# Patient Record
Sex: Female | Born: 1965 | Race: White | Hispanic: No | Marital: Married | Smoking: Former smoker
Health system: Southern US, Community
[De-identification: ages and names within clinical notes are randomized; demographics above are authoritative.]

## PROBLEM LIST (undated history)

## (undated) DIAGNOSIS — L719 Rosacea, unspecified: Secondary | ICD-10-CM

## (undated) DIAGNOSIS — I471 Supraventricular tachycardia, unspecified: Secondary | ICD-10-CM

## (undated) HISTORY — DX: Rosacea, unspecified: L71.9

## (undated) HISTORY — PX: ABLATION OF DYSRHYTHMIC FOCUS: SHX254

## (undated) HISTORY — PX: ENDOMETRIAL ABLATION: SHX621

## (undated) HISTORY — DX: Supraventricular tachycardia, unspecified: I47.10

## (undated) HISTORY — PX: APPENDECTOMY: SHX54

---

## 2000-07-12 ENCOUNTER — Ambulatory Visit (HOSPITAL_COMMUNITY): Admission: RE | Admit: 2000-07-12 | Discharge: 2000-07-13 | Payer: Self-pay | Admitting: Internal Medicine

## 2004-04-12 ENCOUNTER — Other Ambulatory Visit: Admission: RE | Admit: 2004-04-12 | Discharge: 2004-04-12 | Payer: Self-pay | Admitting: Obstetrics & Gynecology

## 2005-03-23 ENCOUNTER — Other Ambulatory Visit: Admission: RE | Admit: 2005-03-23 | Discharge: 2005-03-23 | Payer: Self-pay | Admitting: Obstetrics & Gynecology

## 2006-03-14 HISTORY — PX: BREAST BIOPSY: SHX20

## 2007-01-05 ENCOUNTER — Encounter: Admission: RE | Admit: 2007-01-05 | Discharge: 2007-01-05 | Payer: Self-pay | Admitting: Obstetrics & Gynecology

## 2008-04-14 ENCOUNTER — Encounter: Admission: RE | Admit: 2008-04-14 | Discharge: 2008-04-14 | Payer: Self-pay | Admitting: Obstetrics & Gynecology

## 2008-11-12 LAB — CONVERTED CEMR LAB: Pap Smear: NORMAL

## 2009-10-19 ENCOUNTER — Ambulatory Visit: Payer: Self-pay | Admitting: Internal Medicine

## 2009-10-19 DIAGNOSIS — R238 Other skin changes: Secondary | ICD-10-CM | POA: Insufficient documentation

## 2009-10-19 LAB — CONVERTED CEMR LAB
Basophils Absolute: 0.1 10*3/uL (ref 0.0–0.1)
HCT: 36.9 % (ref 36.0–46.0)
Hemoglobin: 12.5 g/dL (ref 12.0–15.0)
Lymphocytes Relative: 23.8 % (ref 12.0–46.0)
Lymphs Abs: 1.6 10*3/uL (ref 0.7–4.0)
MCV: 90.1 fL (ref 78.0–100.0)
Monocytes Absolute: 0.7 10*3/uL (ref 0.1–1.0)
Monocytes Relative: 10.2 % (ref 3.0–12.0)
Platelets: 256 10*3/uL (ref 150.0–400.0)
Prothrombin Time: 10 s (ref 9.7–11.8)
aPTT: 28.6 s (ref 21.7–28.8)

## 2010-04-13 NOTE — Letter (Signed)
Summary: Results Follow-up Letter  [facility]  [address] [place], [place] [address]   Phone: [phone]  Fax: [phone]    10/19/2009  [address] [place], [place]  [address]  Dear Ms. Hemmerich,   The following are the results of your recent test(s):  Test     Result     Blood work     all normal   _________________________________________________________  Please call for an appointment as needed _________________________________________________________ _________________________________________________________ _________________________________________________________  Sincerely,  Sanda Linger MD [facility]

## 2010-04-13 NOTE — Assessment & Plan Note (Signed)
Summary: NEW/BRUISING /BCBS / #   Vital Signs:  Patient profile:   45 year old female Menstrual status:  regular LMP:     10/14/2009 Height:      64 inches Weight:      123.38 pounds BMI:     21.25 O2 Sat:      98 % on Room air Temp:     98.6 degrees F oral Pulse rate:   73 / minute Pulse rhythm:   regular Resp:     16 per minute BP sitting:   96 / 62  (left arm) Cuff size:   large  Vitals Entered By: Rock Nephew CMA (October 19, 2009 1:27 PM)  O2 Flow:  Room air  Primary Care Provider:  Etta Grandchild MD   History of Present Illness: New to me she complains of some bruising on both thighs for several weeks. She is an avid biker/runner but she can't recall any injuries. She takes Aleve for pain.  Preventive Screening-Counseling & Management  Alcohol-Tobacco     Alcohol drinks/day: 1     Alcohol type: wine     >5/day in last 3 mos: no     Alcohol Counseling: not indicated; use of alcohol is not excessive or problematic     Feels need to cut down: no     Feels annoyed by complaints: no     Feels guilty re: drinking: no     Needs 'eye opener' in am: no     Smoking Status: quit     Year Started: 1983     Year Quit: 2005     Pack years: 10     Tobacco Counseling: to remain off tobacco products  Caffeine-Diet-Exercise     Does Patient Exercise: yes  Hep-HIV-STD-Contraception     Hepatitis Risk: no risk noted     HIV Risk: no risk noted     STD Risk: no risk noted  Safety-Violence-Falls     Seat Belt Use: yes     Helmet Use: yes     Firearms in the Home: firearms in the home     Firearm Counseling: to practice firearm safety     Smoke Detectors: yes     Violence in the Home: no risk noted     Sexual Abuse: no      Sexual History:  currently monogamous.        Drug Use:  never and no.        Blood Transfusions:  no.    Medications Prior to Update: 1)  None  Current Medications (verified): 1)  Doxycycline .... 1/2 Once Daily For Acne  Allergies  (verified): No Known Drug Allergies  Past History:  Past Medical History: Acne  Past Surgical History: Appendectomy Lumpectomy  Family History: Family History Heart Disease Family History Stroke Family History Diabetes  Social History: Occupation: [employer] account manager Married Alcohol use-yes Drug use-no Regular exercise-yes Drug Use:  never, no Does Patient Exercise:  yes Education:  Environmental manager Use:  yes Smoking Status:  quit Hepatitis Risk:  no risk noted HIV Risk:  no risk noted STD Risk:  no risk noted Sexual History:  currently monogamous Blood Transfusions:  no  Review of Systems  The patient denies anorexia, fever, weight loss, hoarseness, chest pain, peripheral edema, prolonged cough, headaches, hemoptysis, abdominal pain, melena, hematochezia, severe indigestion/heartburn, hematuria, suspicious skin lesions, abnormal bleeding, enlarged lymph nodes, angioedema, and breast masses.   GU:  Denies abnormal vaginal bleeding,  discharge, and hematuria. Heme:  Complains of abnormal bruising; denies bleeding, enlarge lymph nodes, fevers, pallor, and skin discoloration.  Physical Exam  General:  alert, well-developed, well-nourished, well-hydrated, appropriate dress, normal appearance, healthy-appearing, cooperative to examination, and good hygiene.   Head:  normocephalic, atraumatic, no abnormalities observed, and no abnormalities palpated.   Eyes:  vision grossly intact, pupils equal, pupils round, and pupils reactive to light.   Ears:  R ear normal and L ear normal.   Mouth:  Oral mucosa and oropharynx without lesions or exudates.  Teeth in good repair. Neck:  supple, full ROM, no masses, no thyromegaly, no JVD, normal carotid upstroke, no carotid bruits, no cervical lymphadenopathy, and no neck tenderness.   Lungs:  normal respiratory effort, no intercostal retractions, no accessory muscle use, normal breath sounds, no dullness, no fremitus, no crackles, and no  wheezes.   Heart:  normal rate, regular rhythm, no murmur, no gallop, no rub, and no JVD.   Abdomen:  soft, non-tender, normal bowel sounds, no distention, no masses, no guarding, no rigidity, no rebound tenderness, no abdominal hernia, no inguinal hernia, no hepatomegaly, and no splenomegaly.   Msk:  normal ROM, no joint tenderness, no joint swelling, no joint warmth, no redness over joints, no joint deformities, no joint instability, and no crepitation.   Pulses:  R and L carotid,radial,femoral,dorsalis pedis and posterior tibial pulses are full and equal bilaterally Extremities:  No clubbing, cyanosis, edema, or deformity noted with normal full range of motion of all joints.   Neurologic:  No cranial nerve deficits noted. Station and gait are normal. Plantar reflexes are down-going bilaterally. DTRs are symmetrical throughout. Sensory, motor and coordinative functions appear intact. Skin:  she has one small, benign-appearing ecchymosis on her right lower thigh medially, it measures about 2 cm and is purple/blue. turgor normal, color normal, no rashes, no suspicious lesions, no petechiae, no purpura, no ulcerations, and no edema.   Cervical Nodes:  no anterior cervical adenopathy and no posterior cervical adenopathy.   Axillary Nodes:  no R axillary adenopathy and no L axillary adenopathy.   Inguinal Nodes:  no R inguinal adenopathy and no L inguinal adenopathy.   Psych:  Cognition and judgment appear intact. Alert and cooperative with normal attention span and concentration. No apparent delusions, illusions, hallucinations   Impression & Recommendations:  Problem # 1:  ECCHYMOSES (ICD-782.9) Assessment New  will check cbc and coags and ask her to stop aleve and other nsaids, i think this will be benign  Venipuncture ([address])  Orders: Venipuncture (60454)  Complete Medication List: 1)  Doxycycline  .... 1/2 once daily for acne  Other Orders: TLB-CBC Platelet - w/Differential  (85025-CBCD) TLB-PT (Protime) (85610-PTP) TLB-PTT (85730-PTTL)  Patient Instructions: 1)  Please schedule a follow-up appointment as needed. 2)  Take 650-1000mg  of Tylenol every 4-6 hours as needed for relief of pain or comfort of fever AVOID taking more than 4000mg   in a 24 hour period (can cause liver damage in higher doses).  Preventive Care Screening  Pap Smear:    Date:  11/12/2008    Results:  normal   Mammogram:    Date:  03/15/2007    Results:  normal

## 2010-07-30 NOTE — Procedures (Signed)
[place]. [facility]  Patient:    Kaitlyn Cole, Kaitlyn Cole                     MRN: [id number] Proc. Date: 07/12/00 Adm. Date:  [id number] Attending:  Lewayne Bunting CC:         Dietrich Pates, M.D. 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  INTRODUCTION:  The patient is a 45 year old woman with a five to six year history of tachypalpitations.  These have been documented to be due to rapid SVT at a rate of 250 beats per minute.  Her tachycardia typically starts and stops suddenly.  She has taken medications in the past, but she has continued to have recurrent palpitations and SVT and is now referred for electrophysiologic study and RF catheter ablation.  DESCRIPTION OF PROCEDURE:  After informed consent was obtained, the patient was taken to the diagnostic EP lab in a fasting state.  After the usual preparation and draping, intravenous fentanyl and midazolam were given for sedation.  A 6 French hexapolar catheter was inserted percutaneously into the right jugular vein and advanced to the coronary sinus.  A 5 French quadripolar catheter was inserted percutaneously in the right femoral vein and advanced to the RV apex.  A 5 French quadripolar catheter was inserted percutaneously in the right femoral vein and advanced to the His bundle region.  A 5 French quadripolar catheter was inserted percutaneously in the right femoral vein and advanced to the right atrium.  After measurement of the basic intervals, rapid ventricular pacing was carried out from the RV apex at a pacing cycle length of 600 msec and stepwise decreased down to 400 msec, where VA Wenckebach was observed.  During rapid ventricular pacing, the atrial activation sequence was midline and decremental.  Next, programmed ventricular  stimulation was carried out from the RV apex at a basic drive cycle length of 147 msec.  The S1-S2 interval was stepwise decreased down to 380 msec, where VA Wenckebach was observed.  Once again, the atrial activation sequence was midline and decremental.  Next, programmed atrial stimulation was carried out from the coronary sinus at a basic drive cycle length of 829 msec and stepwise decreased down to 350 msec, where the ERP of the AV node was observed.  During programmed atrial stimulation, initially there was no AH jump and no echo beats.  Next, rapid atrial pacing was carried out from the coronary sinus at a pacing cycle length of 490 msec and stepwise decreased down to 330 msec, where AV Wenckebach was observed.  During rapid atrial pacing, the PR interval became greater than the RR interval, but there was no inducible SVT. Isoproterenol at 2 mcg/min. was subsequently infused, and additional rapid atrial pacing was carried out, this time from the high right atrium at a cycle length of 260 msec.  The patient developed SVT.  The cycle length of the tachycardia was 243 msec.  PVCs were placed at the time of His bundle refractoriness and demonstrated no evidence of any atrial pre-excitation. During tachycardia, ventricular pacing was carried out very briefly at a cycle length of 220 msec, demonstrating VAV conduction.  Finally, mapping of the tachycardia demonstrated the earliest atrial activation to be at the His bundle electrogram, and the [place] time was approximately 30 msec when measured in the high right atrium.  With a diagnosis of AV node reentrant tachycardia firmly in hand, the ablation catheter was subsequently maneuvered into Kochs triangle.  Kochs triangle was of the normal size and orientation.  A single RF energy application was delivered at site 9 in Kochs triangle for a total of 63 seconds.  This resulted in a prolonged episode of accelerated junctional rhythm.  At this point,  the patient was observed for approximately 30 minutes. During this time, isoproterenol was restarted and continued and rapid atrial pacing was again carried out, both from the high right atrium and the coronary sinus.  The PR interval was now less than the RR interval, and there was no evidence of inducible SVT.  In addition, there was only a rare echo beat noted.  At this point, the catheters were removed, isoproterenol was discontinued, and the patient was returned to her room in satisfactory condition.  COMPLICATIONS:  None.  RESULTS:  A. BASELINE ELECTROCARDIOGRAM:  The baseline ECG demonstrates normal sinus    rhythm with normal axis and intervals.  There was no ventricular    pre-excitation noted.  B. BASELINE INTERVALS:  The sinus node cycle length was 942 msec.  The HV    interval was 35 msec.  The QRS duration was 100 msec.  C. RAPID ATRIAL PACING:  Rapid atrial pacing was carried out from both    coronary sinus and high right atrium.  Initially the AV Wenckebach cycle    length was 330 msec.  Following isoproterenol infusion, SVT was induced.  D. PROGRAMMED ATRIAL STIMULATION:  Programmed atrial stimulation was carried    out from the high right atrium at a basic drive cycle length of 086 msec.    The S1-S2 interval was stepwise decreased down to 350 msec, where the ERP    of the AV node was observed.  Following RF energy application and on    isoproterenol, there was a rare AH jump and echo beat noted.  This was not    reproducible, however.  E. RAPID VENTRICULAR PACING:  Rapid ventricular pacing was carried out from the    RV apex at a pacing cycle length of 500 msec and stepwise decreased down to    400 msec, where VA Wenckebach was observed.  During rapid ventricular    pacing, the atrial activation sequence was midline and decremental.  F. PROGRAMMED VENTRICULAR STIMULATION:  Programmed ventricular stimulation    was carried out from the RV apex at a basic drive  cycle length of 578 msec.     The S1-S2 interval was stepwise decreased down to 380 msec, where    retrograde AV node ERP was observed.  G. ARRHYTHMIAS OBSERVED:  AV node reentrant tachycardia.  Initiation:  Rapid    atrial pacing, on isoproterenol.  Duration:  Sustained.  Cycle length was    243 msec.  H. MAPPING:  Mapping of the patients tachycardia demonstrated the earliest    atrial activation in the His bundle electrogram.  In addition, the Kochs    triangle was of the normal size and orientation.  I. RADIOFREQUENCY ENERGY APPLICATION:  A single RF energy application was    subsequently delivered for a total of 63 seconds.  During his time, there    was accelerated junctional rhythm.  CONCLUSION:  This study demonstrates successful catheter ablation of AV node reentry tachycardia with a single RF energy application delivered to site 9 in Kochs triangle.  There were no immediate procedural complications. DD:  07/12/00 TD:  07/13/00 Job: [id number]

## 2010-07-30 NOTE — Discharge Summary (Signed)
[place]. [facility]  Patient:    Kaitlyn Cole, Kaitlyn Cole                       MRN: [id number] Adm. Date:  07/12/00 Disc. Date: 07/13/00 Attending:  Doylene Canning. Ladona Ridgel, M.D. [facility] Dictator:   Chinita Pester, N.P.                           Discharge Summary  PRIMARY DIAGNOSIS:  Supraventricular tachycardia.  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  HISTORY OF PRESENT ILLNESS:  This is a 45 year old female with a history of SVT which began in 1995.  She had two episodes of SVT with rates of at least 250.  Each required IV adenosine in the emergency department for termination. She had been on atenolol 50 a day, which was decreased to 25 a day, tolerated fairly well.  She notes her last episode of SVT was several years ago; however, she feels tired on the beta blockers.  PAST MEDICAL HISTORY:  Unremarkable.  HOSPITAL COURSE:  The patient was admitted and underwent EP study and radiofrequency ablation of the AV nodal ______ tachycardia without complications.  She tolerated the procedure well and was discharged the following day in stable condition.  She was discharged on the following medications.  MEDICATIONS: 1. Tenormin 25 mg daily for five days. 2. Coated aspirin 325 daily for six weeks. 3. Antibiotics prior to any dental work or GYN procedures for the next three    months.  ACTIVITY:  ______ activity for four days.  No driving for two days.  WOUND CARE:  She was to keep her wounds clean and dry.  She was allowed to shower.  FOLLOW-UP:  She would follow up with Dr. Ladona Ridgel in four to six weeks.  The office will give her a call with that appointment. DD:  07/13/00 TD:  07/14/00 Job: [id number]

## 2011-07-07 ENCOUNTER — Other Ambulatory Visit: Payer: Self-pay | Admitting: Obstetrics & Gynecology

## 2012-08-20 ENCOUNTER — Other Ambulatory Visit: Payer: Self-pay | Admitting: Dermatology

## 2013-11-15 ENCOUNTER — Ambulatory Visit (INDEPENDENT_AMBULATORY_CARE_PROVIDER_SITE_OTHER): Payer: 59 | Admitting: Physician Assistant

## 2013-11-15 VITALS — BP 122/80 | HR 70 | Temp 97.8°F | Resp 16 | Ht 64.5 in | Wt 125.0 lb

## 2013-11-15 DIAGNOSIS — Z79890 Hormone replacement therapy (postmenopausal): Secondary | ICD-10-CM | POA: Insufficient documentation

## 2013-11-15 DIAGNOSIS — H6981 Other specified disorders of Eustachian tube, right ear: Secondary | ICD-10-CM

## 2013-11-15 DIAGNOSIS — H6990 Unspecified Eustachian tube disorder, unspecified ear: Secondary | ICD-10-CM

## 2013-11-15 DIAGNOSIS — H6980 Other specified disorders of Eustachian tube, unspecified ear: Secondary | ICD-10-CM

## 2013-11-15 MED ORDER — IPRATROPIUM BROMIDE 0.03 % NA SOLN: 2.0000 | Freq: Two times a day (BID) | NASAL | Status: DC

## 2013-11-15 NOTE — Progress Notes (Signed)
Subjective:    Patient ID: Kaitlyn Cole, female    DOB: 1965/09/05, 48 y.o.   MRN: [id number]   PCP: No PCP Per Patient  Chief Complaint  Patient presents with  . Ear Fullness    x month      Active Ambulatory Problems    Diagnosis Date Noted  . Hormone replacement therapy (HRT) 11/15/2013   Resolved Ambulatory Problems    Diagnosis Date Noted  . ECCHYMOSES 10/19/2009   No Additional Past Medical History    Past Surgical History  Procedure Laterality Date  . Endometrial ablation    . Appendectomy    . Cesarean section      x 2  . Ablation of dysrhythmic focus      No Known Allergies  Prior to Admission medications   Not on File    History   Social History  . Marital Status: Married    Spouse Name: Forestine Chute    Number of Children: 2  . Years of Education: 14   Occupational History  . real estate agent    Social History Main Topics  . Smoking status: Former Research scientist (life sciences)  . Smokeless tobacco: Never Used  . Alcohol Use: 1.0 oz/week    2 drink(s) per week  . Drug Use: No  . Sexual Activity: Yes    Partners: Male   Other Topics Concern  . None   Social History Narrative   Lives with her husband. Her children live independently.    family history includes Diabetes in her mother; Heart disease in her mother. indicated that her mother is deceased. She indicated that her sister is alive. She indicated that her brother is alive. She indicated that her daughter is alive. She indicated that her son is alive.   HPI  This 48 y.o. female presents for evaluation of RIGHT ear discomfort x 1 month.  Feels like their fluid in it.  Can hear cracking/popping, like I'm in a barrel. Flying next week, and wants to make sure it's ok. Feels like she can't hear well-muffled. Had some dental work around the time this began, but the dental problem is now resolved.   Review of Systems As above.    Objective:   Physical Exam  Vitals reviewed. Constitutional:  She is oriented to person, place, and time. Vital signs are normal. She appears well-developed and well-nourished. No distress.  HENT:  Head: Normocephalic and atraumatic.  Right Ear: Hearing, tympanic membrane, external ear and ear canal normal.  Left Ear: Hearing, tympanic membrane, external ear and ear canal normal.  Nose: Mucosal edema (minimal) present. No rhinorrhea.  No foreign bodies. Right sinus exhibits no maxillary sinus tenderness and no frontal sinus tenderness. Left sinus exhibits no maxillary sinus tenderness and no frontal sinus tenderness.  Mouth/Throat: Uvula is midline, oropharynx is clear and moist and mucous membranes are normal. No uvula swelling. No oropharyngeal exudate.  Eyes: Conjunctivae and EOM are normal. Pupils are equal, round, and reactive to light. Right eye exhibits no discharge. Left eye exhibits no discharge. No scleral icterus.  Neck: Trachea normal, normal range of motion and full passive range of motion without pain. Neck supple. No mass and no thyromegaly present.  Cardiovascular: Normal rate, regular rhythm and normal heart sounds.   Pulmonary/Chest: Effort normal and breath sounds normal.  Lymphadenopathy:       Head (right side): No submandibular, no tonsillar, no preauricular, no posterior auricular and no occipital adenopathy present.  Head (left side): No submandibular, no tonsillar, no preauricular and no occipital adenopathy present.    She has no cervical adenopathy.       Right: No supraclavicular adenopathy present.       Left: No supraclavicular adenopathy present.  Neurological: She is alert and oriented to person, place, and time. She has normal strength. No cranial nerve deficit or sensory deficit.  Skin: Skin is warm, dry and intact. No rash noted.  Psychiatric: She has a normal mood and affect. Her speech is normal and behavior is normal.          Assessment & Plan:  1. ETD (eustachian tube dysfunction), right Anticipatory  guidance. Supportive care. Reviewed ways to reduce discomfort with air travel. - ipratropium (ATROVENT) 0.03 % nasal spray; Place 2 sprays into both nostrils 2 (two) times daily.  Dispense: 30 mL; Refill: 0   Fara Chute, PA-C Physician Assistant-Certified [facility]

## 2013-11-15 NOTE — Patient Instructions (Signed)
Barotitis Media Barotitis media is inflammation of your middle ear. This occurs when the auditory tube (eustachian tube) leading from the back of your nose (nasopharynx) to your eardrum is blocked. This blockage may result from a cold, environmental allergies, or an upper respiratory infection. Unresolved barotitis media may lead to damage or hearing loss (barotrauma), which may become permanent. HOME CARE INSTRUCTIONS   Use medicines as recommended by your health care provider. Over-the-counter medicines will help unblock the canal and can help during times of air travel.  Do not put anything into your ears to clean or unplug them. Eardrops will not be helpful.  Do not swim, dive, or fly until your health care provider says it is all right to do so. If these activities are necessary, chewing gum with frequent, forceful swallowing may help. It is also helpful to hold your nose and gently blow to pop your ears for equalizing pressure changes. This forces air into the eustachian tube.  Only take over-the-counter or prescription medicines for pain, discomfort, or fever as directed by your health care provider.  A decongestant may be helpful in decongesting the middle ear and make pressure equalization easier. SEEK MEDICAL CARE IF:  You experience a serious form of dizziness in which you feel as if the room is spinning and you feel nauseated (vertigo).  Your symptoms only involve one ear. SEEK IMMEDIATE MEDICAL CARE IF:   You develop a severe headache, dizziness, or severe ear pain.  You have bloody or pus-like drainage from your ears.  You develop a fever.  Your problems do not improve or become worse. MAKE SURE YOU:   Understand these instructions.  Will watch your condition.  Will get help right away if you are not doing well or get worse. Document Released: 02/26/2000 Document Revised: 12/19/2012 Document Reviewed: 09/25/2012 ExitCare Patient Information 2015 [employer]. This  information is not intended to replace advice given to you by your health care provider. Make sure you discuss any questions you have with your health care provider.  

## 2014-03-25 ENCOUNTER — Other Ambulatory Visit: Payer: Self-pay | Admitting: Obstetrics & Gynecology

## 2014-03-26 ENCOUNTER — Encounter: Payer: Self-pay | Admitting: Internal Medicine

## 2014-03-26 LAB — CYTOLOGY - PAP

## 2014-05-22 ENCOUNTER — Encounter: Payer: Self-pay | Admitting: Family Medicine

## 2014-05-22 ENCOUNTER — Ambulatory Visit (INDEPENDENT_AMBULATORY_CARE_PROVIDER_SITE_OTHER): Payer: Managed Care, Other (non HMO) | Admitting: Family Medicine

## 2014-05-22 VITALS — BP 93/55 | HR 65 | Temp 98.5°F | Resp 16 | Ht 64.5 in | Wt 131.0 lb

## 2014-05-22 DIAGNOSIS — H9191 Unspecified hearing loss, right ear: Secondary | ICD-10-CM | POA: Diagnosis not present

## 2014-05-22 NOTE — Progress Notes (Signed)
S:  This 49 y.o. Female is here for re-evaluation of R ear hearing loss, onset ~ 6 months ago. She was evaluated at that time and diagnosis was eustachian tube dysfunction; pt has tried allergy medication and ipratropium nasal spray w/o improvement. She feels like she is underwater and cannot hear on R side. She denies pain, tinnitus, sore throat, difficulty swallowing, rhinorrhea, PND, sinus pressure, cough, HA, dizziness or loss of equilibrium. She denies trauma to R ear or head/neck area.  Patient Active Problem List   Diagnosis Date Noted  . Hormone replacement therapy (HRT) 11/15/2013    Prior to Admission medications   Medication Sig Start Date End Date Taking? Authorizing Provider  ipratropium (ATROVENT) 0.03 % nasal spray Place 2 sprays into both nostrils 2 (two) times daily. 11/15/13  No Chelle Janalee Dane, PA-C    Past Surgical History  Procedure Laterality Date  . Endometrial ablation    . Appendectomy    . Cesarean section      x 2  . Ablation of dysrhythmic focus      History   Social History  . Marital Status: Married    Spouse Name: Forestine Chute  . Number of Children: 2  . Years of Education: 14   Occupational History  . real estate agent    Social History Main Topics  . Smoking status: Former Research scientist (life sciences)  . Smokeless tobacco: Never Used  . Alcohol Use: 1.0 oz/week    2 drink(s) per week  . Drug Use: No  . Sexual Activity:    Partners: Male   Other Topics Concern  . Not on file   Social History Narrative   Lives with her husband. Her children live independently.   Family History  Problem Relation Age of Onset  . Heart disease Mother     rheumatic heart disease; two surgeries before age 80  . Diabetes Mother   No family hx of hearing loss.  ROS: AS per HPI.  O: Filed Vitals:   05/22/14 1628  BP: 93/55  Pulse: 65  Temp: 98.5 F (36.9 C)  Resp: 16    GEN: In NAD; WN,WD. HENT: [place]/AT; EOMI w/ clear conj/sclerae. Ext ears/EACs normal. TMs dull  w/o erythema, scarring or other abnormality. Nasal mucosa normal. Mouth- normal dentition; post ph w/ midline uvula and mild erythema but no lesions. NECK: SUpple w/o LAN or TMG. COR: RRR. LUNGS: Normal resp arte and effort. SKIN: W&D; intact w/o pallor or erythema. NEURO: A&O x 3; CNs intact. Nonfocal.  A/P: Hearing loss in right ear - Plan: Ambulatory referral to ENT

## 2014-05-22 NOTE — Patient Instructions (Signed)
Hearing Loss A hearing loss is sometimes called deafness. Hearing loss may be partial or total. CAUSES Hearing loss may be caused by:  Wax in the ear canal.  Infection of the ear canal.  Infection of the middle ear.  Trauma to the ear or surrounding area.  Fluid in the middle ear.  A hole in the eardrum (perforated eardrum).  Exposure to loud sounds or music.  Problems with the hearing nerve.  Certain medications. Hearing loss without wax, infection, or a history of injury may mean that the nerve is involved. Hearing loss with severe dizziness, nausea and vomiting or ringing in the ear may suggest a hearing nerve irritation or problems in the middle or inner ear. If hearing loss is untreated, there is a greater likelihood for residual or permanent hearing loss. DIAGNOSIS A hearing test (audiometry) assesses hearing loss. The audiometry test needs to be performed by a hearing specialist (audiologist). TREATMENT Treatment for recent onset of hearing loss may include:  Ear wax removal.  Medications that kill germs (antibiotics).  Cortisone medications.  Prompt follow up with the appropriate specialist. Return of hearing depends on the cause of your hearing loss, so proper medical follow-up is important. Some hearing loss may not be reversible, and a caregiver should discuss care and treatment options with you. SEEK MEDICAL CARE IF:   You have a severe headache, dizziness, or changes in vision.  You have new or increased weakness.  You develop repeated vomiting or other serious medical problems.  You have a fever. Document Released: 02/28/2005 Document Revised: 05/23/2011 Document Reviewed: 06/25/2009 ExitCare Patient Information 2015 [employer]. This information is not intended to replace advice given to you by your health care provider. Make sure you discuss any questions you have with your health care provider.  

## 2014-05-29 ENCOUNTER — Encounter: Payer: Self-pay | Admitting: Internal Medicine

## 2014-06-24 ENCOUNTER — Encounter: Payer: Self-pay | Admitting: Obstetrics & Gynecology

## 2015-03-13 ENCOUNTER — Ambulatory Visit
Admission: RE | Admit: 2015-03-13 | Discharge: 2015-03-13 | Disposition: A | Discharge status: Home / Self Care | Payer: Managed Care, Other (non HMO) | Source: Ambulatory Visit | Attending: *Deleted | Admitting: *Deleted

## 2015-03-13 ENCOUNTER — Other Ambulatory Visit: Payer: Self-pay | Admitting: *Deleted

## 2015-03-13 DIAGNOSIS — M436 Torticollis: Secondary | ICD-10-CM

## 2015-06-23 ENCOUNTER — Other Ambulatory Visit: Payer: Self-pay | Admitting: Obstetrics & Gynecology

## 2015-06-23 DIAGNOSIS — N6320 Unspecified lump in the left breast, unspecified quadrant: Secondary | ICD-10-CM

## 2015-06-29 ENCOUNTER — Ambulatory Visit
Admission: RE | Admit: 2015-06-29 | Discharge: 2015-06-29 | Disposition: A | Discharge status: Home / Self Care | Payer: Managed Care, Other (non HMO) | Source: Ambulatory Visit | Attending: Obstetrics & Gynecology | Admitting: Obstetrics & Gynecology

## 2015-06-29 DIAGNOSIS — N6320 Unspecified lump in the left breast, unspecified quadrant: Secondary | ICD-10-CM

## 2016-11-01 ENCOUNTER — Encounter (HOSPITAL_COMMUNITY): Payer: Self-pay | Admitting: Emergency Medicine

## 2016-11-01 ENCOUNTER — Emergency Department (HOSPITAL_COMMUNITY)
Admission: EM | Admit: 2016-11-01 | Discharge: 2016-11-01 | Disposition: A | Discharge status: Home / Self Care | Payer: No Typology Code available for payment source | Attending: Emergency Medicine | Admitting: Emergency Medicine

## 2016-11-01 ENCOUNTER — Emergency Department (HOSPITAL_COMMUNITY): Payer: No Typology Code available for payment source

## 2016-11-01 DIAGNOSIS — S40819A Abrasion of unspecified upper arm, initial encounter: Secondary | ICD-10-CM

## 2016-11-01 DIAGNOSIS — Y9302 Activity, running: Secondary | ICD-10-CM | POA: Diagnosis not present

## 2016-11-01 DIAGNOSIS — S80819A Abrasion, unspecified lower leg, initial encounter: Secondary | ICD-10-CM

## 2016-11-01 DIAGNOSIS — S0081XA Abrasion of other part of head, initial encounter: Secondary | ICD-10-CM | POA: Diagnosis not present

## 2016-11-01 DIAGNOSIS — Z79899 Other long term (current) drug therapy: Secondary | ICD-10-CM | POA: Diagnosis not present

## 2016-11-01 DIAGNOSIS — S81011A Laceration without foreign body, right knee, initial encounter: Secondary | ICD-10-CM

## 2016-11-01 DIAGNOSIS — Y999 Unspecified external cause status: Secondary | ICD-10-CM | POA: Insufficient documentation

## 2016-11-01 DIAGNOSIS — Z87891 Personal history of nicotine dependence: Secondary | ICD-10-CM | POA: Insufficient documentation

## 2016-11-01 DIAGNOSIS — W0110XA Fall on same level from slipping, tripping and stumbling with subsequent striking against unspecified object, initial encounter: Secondary | ICD-10-CM | POA: Diagnosis not present

## 2016-11-01 DIAGNOSIS — Y929 Unspecified place or not applicable: Secondary | ICD-10-CM | POA: Insufficient documentation

## 2016-11-01 DIAGNOSIS — S8990XA Unspecified injury of unspecified lower leg, initial encounter: Secondary | ICD-10-CM | POA: Diagnosis present

## 2016-11-01 DIAGNOSIS — W19XXXA Unspecified fall, initial encounter: Secondary | ICD-10-CM

## 2016-11-01 DIAGNOSIS — S81021A Laceration with foreign body, right knee, initial encounter: Secondary | ICD-10-CM | POA: Insufficient documentation

## 2016-11-01 DIAGNOSIS — S80212A Abrasion, left knee, initial encounter: Secondary | ICD-10-CM | POA: Insufficient documentation

## 2016-11-01 MED ORDER — LIDOCAINE-EPINEPHRINE (PF) 2 %-1:200000 IJ SOLN
10.0000 mL | Freq: Once | INTRAMUSCULAR | Status: AC
  Administered 2016-11-01: 10 mL
  Filled 2016-11-01: qty 20

## 2016-11-01 MED ORDER — AMOXICILLIN-POT CLAVULANATE 875-125 MG PO TABS: 1.0000 | ORAL_TABLET | Freq: Two times a day (BID) | ORAL | 0 refills | Status: DC

## 2016-11-01 MED ORDER — TETANUS-DIPHTH-ACELL PERTUSSIS 5-2.5-18.5 LF-MCG/0.5 IM SUSP
0.5000 mL | Freq: Once | INTRAMUSCULAR | Status: AC
  Administered 2016-11-01: 0.5 mL via INTRAMUSCULAR
  Filled 2016-11-01: qty 0.5

## 2016-11-01 MED ORDER — ACETAMINOPHEN 325 MG PO TABS
650.0000 mg | ORAL_TABLET | Freq: Once | ORAL | Status: AC
  Administered 2016-11-01: 650 mg via ORAL
  Filled 2016-11-01: qty 2

## 2016-11-01 NOTE — ED Provider Notes (Signed)
[facility] DEPT Provider Note   CSN: [id number] Arrival date & time: 11/01/16  1231     History   Chief Complaint Chief Complaint  Patient presents with  . Fall  . Knee Injury  . Head Injury  . Abrasion    HPI Kaitlyn Cole is a 51 y.o. female presenting with b/l knee pain s/p mechanical fall today. She states she was jogging and tripped falling forward onto both of her knees and scraping side of her face. She states pain in her knees is minimal, worse with movement. Reports associated laceration to right knee, and abrasion to left knee. Patient also reports abrasion to right palm as well as left-sided for face. She denies headache, vision changes, nausea, vomiting neck or back pain, or any other symptoms today.  The history is provided by the patient.    History reviewed. No pertinent past medical history.  Patient Active Problem List   Diagnosis Date Noted  . Hormone replacement therapy (HRT) 11/15/2013    Past Surgical History:  Procedure Laterality Date  . ABLATION OF DYSRHYTHMIC FOCUS    . APPENDECTOMY    . CESAREAN SECTION     x 2  . ENDOMETRIAL ABLATION      OB History    No data available       Home Medications    Prior to Admission medications   Medication Sig Start Date End Date Taking? Authorizing Provider  amoxicillin-clavulanate (AUGMENTIN) 875-125 MG tablet Take 1 tablet by mouth every 12 (twelve) hours. 11/01/16   Russo, [place] N, PA-C  ipratropium (ATROVENT) 0.03 % nasal spray Place 2 sprays into both nostrils 2 (two) times daily. 11/15/13   Harrison Mons, PA-C    Family History Family History  Problem Relation Age of Onset  . Heart disease Mother        rheumatic heart disease; two surgeries before age 73  . Diabetes Mother     Social History Social History  Substance Use Topics  . Smoking status: Former Research scientist (life sciences)  . Smokeless tobacco: Never Used  . Alcohol use 1.0 oz/week    2 Standard drinks or equivalent per week      Allergies   Patient has no known allergies.   Review of Systems Review of Systems  Constitutional: Negative for fever.  HENT: Positive for facial swelling.   Eyes: Negative for visual disturbance.  Respiratory: Negative for shortness of breath.   Cardiovascular: Negative for chest pain.  Gastrointestinal: Negative for abdominal pain, nausea and vomiting.  Musculoskeletal: Negative for back pain and neck pain.  Skin: Positive for wound.  Allergic/Immunologic: Negative for immunocompromised state.  Neurological: Negative for dizziness, syncope, weakness, numbness and headaches.     Physical Exam Updated Vital Signs BP (!) 110/51 (BP Location: Left Arm)   Pulse 75   Temp 99.1 F (37.3 C) (Oral)   Resp 18   Ht 5\' 4"  (1.626 m)   Wt 57.6 kg (127 lb)   SpO2 100%   BMI 21.80 kg/m   Physical Exam  Constitutional: She is oriented to person, place, and time. She appears well-developed and well-nourished. No distress.  Well-appearing  HENT:  Head: Normocephalic and atraumatic.  Mouth/Throat: Oropharynx is clear and moist.  Left lateral upper lip with contusion, no laceration noted. Teeth are not loose. Left face without tenderness, crepitus, or deformities in association with abrasions.   Eyes: Pupils are equal, round, and reactive to light. Conjunctivae and EOM are normal.  Neck: Normal range of motion.  Neck supple.  Cardiovascular: Normal rate, regular rhythm, normal heart sounds and intact distal pulses.   Pulmonary/Chest: Effort normal and breath sounds normal. No respiratory distress. She has no wheezes. She exhibits no tenderness.  Abdominal: Soft. Bowel sounds are normal. She exhibits no distension. There is no tenderness.  Musculoskeletal:  Knees without tenderness over joint line, edema or deformities. Negative anterior and posterior drawer test b/l. Full range of motion of b/l knees, both active and resistive flexion and extension. No spinal or spinal tenderness.  No bony step-offs, no gross deformities. Moving all extremities.   Neurological: She is alert and oriented to person, place, and time. She displays normal reflexes. No cranial nerve deficit or sensory deficit. She exhibits normal muscle tone. Coordination normal.  CN intact. 5/5 strength bilateral upper and lower extremities. Normal finger-nose and heel-to-shin. Normal gait.  Skin: Skin is warm.  4 cm jagged laceration over right patella and superficial abrasion over left patella. Superficial abrasions to right proximal palm. Superficial abrasions to left cheek, eyebrow, and left mandible.  Psychiatric: She has a normal mood and affect. Her behavior is normal.  Nursing note and vitals reviewed.      ED Treatments / Results  Labs (all labs ordered are listed, but only abnormal results are displayed) Labs Reviewed - No data to display  EKG  EKG Interpretation None       Radiology Dg Knee Complete 4 Views Left  Result Date: 11/01/2016 CLINICAL DATA:  Fall today with anterior knee lacerations and pain, right-greater-than-left. Initial encounter. EXAM: LEFT KNEE - COMPLETE 4+ VIEW COMPARISON:  None. FINDINGS: The mineralization and alignment are normal. There is no evidence of acute fracture or dislocation. The joint spaces are maintained. There is no significant joint effusion. There is mild prepatellar soft tissue edema. Tiny radiodensity projecting anterior to the proximal tibia is also present on the images of the right knee and appears to be artifactual. IMPRESSION: No acute osseous findings or joint effusion. Electronically Signed   By: Richardean Sale M.D.   On: 11/01/2016 14:25   Dg Knee Complete 4 Views Right  Result Date: 11/01/2016 CLINICAL DATA:  Fall today with anterior knee lacerations and pain, right-greater-than-left. Initial encounter. EXAM: RIGHT KNEE - COMPLETE 4+ VIEW COMPARISON:  None. FINDINGS: The mineralization and alignment are normal. There is no evidence of  acute fracture or dislocation. The joint spaces are maintained. There is a probable small knee joint effusion as well as mild prepatellar soft tissue swelling. Small radiodensity projecting over the proximal tibia changes is orientation with respect to the bone and is also present on the images of the left knee, appearing artifactual. IMPRESSION: No acute osseous findings.  Small joint effusion. Electronically Signed   By: Richardean Sale M.D.   On: 11/01/2016 14:26    Procedures .Marland KitchenLaceration Repair Date/Time: 11/01/2016 8:48 PM Performed by: RUSSO, [place] N Authorized by: RUSSO, [place] N   Consent:    Consent obtained:  Verbal   Consent given by:  Patient   Risks discussed:  Infection, pain, retained foreign body, tendon damage, need for additional repair, poor cosmetic result and poor wound healing   Alternatives discussed:  No treatment and delayed treatment Anesthesia (see MAR for exact dosages):    Anesthesia method:  Local infiltration   Local anesthetic:  Lidocaine 2% WITH epi Laceration details:    Location:  Leg   Leg location:  R knee   Length (cm):  5 Repair type:    Repair type:  Simple Pre-procedure details:    Preparation:  Patient was prepped and draped in usual sterile fashion and imaging obtained to evaluate for foreign bodies Exploration:    Hemostasis achieved with:  Direct pressure   Wound exploration: wound explored through full range of motion and entire depth of wound probed and visualized     Wound extent: no foreign bodies/material noted, no underlying fracture noted and no vascular damage noted     Contaminated: no   Treatment:    Area cleansed with:  Saline   Amount of cleaning:  Extensive   Irrigation solution:  Sterile saline   Irrigation method:  Pressure wash and syringe   Visualized foreign bodies/material removed: yes (gravel)   Skin repair:    Repair method:  Sutures   Suture size:  4-0   Suture material:  Prolene   Suture technique:  Simple  interrupted   Number of sutures:  9 Approximation:    Approximation:  Close   Vermilion border: well-aligned   Post-procedure details:    Dressing:  Non-adherent dressing and splint for protection   Patient tolerance of procedure:  Tolerated well, no immediate complications Comments:     Base of wound was evaluated by Dr. Townsend Roger, with patellar tendon sheath visualized. Superficial layer of tendon sheath with damange, however tendon itself appears intact with preserved active knee extension and flexion during direct visualization of tendon.  No joint fluid draining/expressed. Wound with some gravel, that was copious irrigated until not particles visualized.    (including critical care time)  Medications Ordered in ED Medications  acetaminophen (TYLENOL) tablet 650 mg (650 mg Oral Given 11/01/16 1756)  Tdap (BOOSTRIX) injection 0.5 mL (0.5 mLs Intramuscular Given 11/01/16 1828)  lidocaine-EPINEPHrine (XYLOCAINE W/EPI) 2 %-1:200000 (PF) injection 10 mL (10 mLs Infiltration Given by Other 11/01/16 2024)     Initial Impression / Assessment and Plan / ED Course  I have reviewed the triage vital signs and the nursing notes.  Pertinent labs & imaging results that were available during my care of the patient were reviewed by me and considered in my medical decision making (see chart for details).     Patient with right knee laceration and multiple abrasions status post mechanical fall. X-ray of bilateral knees negative for acute fracture. Patient without signs of serious head, neck, or back injury. No HA, vision changes, N/V. Normal neurological exam. B/l knees with nl ROM. Pressure irrigation performed. Wound explored and base of wound visualized in a bloodless field with Dr. Marcha Dutton present; appears to be small injury to patella tendon sheath. Knee with full active and resistive ROM, base of wound visualized during range of motion without evidence of joint penetration or need for additional  repair. Small pieces of gravel in wound, which were removed with copious irrigation. Laceration occurred < 8 hours prior to repair which was well tolerated. Tdap updated.  Pt has no comorbidities to effect normal wound healing. Given depth of wound and contaminate, pt discharged with Augmentin. Knee immobilizer brace applied for wound healing, and instructed pt to remove after 5-7 days. Discussed suture home care with patient and answered questions. Pt to follow-up for wound check and suture removal in 10-14 days; they are to return to the ED sooner for signs of infection. Pt is hemodynamically stable with no complaints prior to dc.   Patient discussed with and seen by Dr. Marcha Dutton, who guided treatment and agrees with care plan.  Discussed results, findings, treatment and follow up. Patient advised  of return precautions. Patient verbalized understanding and agreed with plan.   Final Clinical Impressions(s) / ED Diagnoses   Final diagnoses:  Knee laceration, right, initial encounter  Abrasion of face and extremities, initial encounter  Fall, initial encounter    New Prescriptions Discharge Medication List as of 11/01/2016  8:00 PM    START taking these medications   Details  amoxicillin-clavulanate (AUGMENTIN) 875-125 MG tablet Take 1 tablet by mouth every 12 (twelve) hours., Starting Tue 11/01/2016, Print         Virgina Jock, [place] N, PA-C 11/01/16 2242    Pixie Casino, MD 11/01/16 [phone]

## 2016-11-01 NOTE — ED Notes (Signed)
WOUND CLEANED AND DRESSED

## 2016-11-01 NOTE — Discharge Instructions (Signed)
Please read instructions below.  Keep your wound clean and covered. Do not get your right knee wet for the next 24 hours. After that time, you can wash it in the shower, letting soap and water run over it. Pat it dry and recover with dry a bandage. Do not soak in the tub or apply ointment. Wear the knee brace for the next 5-7 days to allow your wound to heal without tension. Begin taking the antibiotic, Augmentin, 2 times per day until it is gone. You can apply antibiotic ointment to your abrasions. You can take Advil, 600 mg, every 6 hours as needed for pain. Follow up with your primary care or urgent care for wound recheck and suture removal in 10-14 days.  Return to the ER for fever, pus draining from wound, redness, or new or worsening symptoms.

## 2016-11-01 NOTE — ED Triage Notes (Signed)
Patient in from home reports that she was out for morning job, tripped and fell. Bilateral knee lacerations noted, bleeding controlled. Facial abrasions. Reports hitting head also.

## 2017-09-18 ENCOUNTER — Other Ambulatory Visit: Payer: Self-pay | Admitting: Obstetrics & Gynecology

## 2017-09-18 DIAGNOSIS — Z1231 Encounter for screening mammogram for malignant neoplasm of breast: Secondary | ICD-10-CM

## 2017-09-19 ENCOUNTER — Other Ambulatory Visit: Payer: Self-pay | Admitting: Obstetrics & Gynecology

## 2017-09-19 DIAGNOSIS — D242 Benign neoplasm of left breast: Secondary | ICD-10-CM

## 2017-09-21 ENCOUNTER — Other Ambulatory Visit: Payer: Self-pay | Admitting: Obstetrics & Gynecology

## 2017-09-21 DIAGNOSIS — D242 Benign neoplasm of left breast: Secondary | ICD-10-CM

## 2017-09-21 DIAGNOSIS — N6320 Unspecified lump in the left breast, unspecified quadrant: Secondary | ICD-10-CM

## 2017-09-26 ENCOUNTER — Other Ambulatory Visit: Payer: No Typology Code available for payment source

## 2018-03-27 ENCOUNTER — Ambulatory Visit
Admission: RE | Admit: 2018-03-27 | Discharge: 2018-03-27 | Disposition: A | Discharge status: Home / Self Care | Payer: Managed Care, Other (non HMO) | Source: Ambulatory Visit | Attending: Obstetrics & Gynecology | Admitting: Obstetrics & Gynecology

## 2018-03-27 ENCOUNTER — Ambulatory Visit: Payer: No Typology Code available for payment source

## 2018-03-27 DIAGNOSIS — N6320 Unspecified lump in the left breast, unspecified quadrant: Secondary | ICD-10-CM

## 2019-06-10 LAB — COLOGUARD: COLOGUARD: NEGATIVE

## 2019-06-19 ENCOUNTER — Telehealth: Payer: Self-pay

## 2019-06-19 NOTE — Telephone Encounter (Signed)
NOTES ON FILE FROM  [facility] [phone], SENT NOTES TO SCHEDULING

## 2019-07-23 ENCOUNTER — Telehealth: Payer: Self-pay

## 2019-07-23 NOTE — Telephone Encounter (Signed)
NOTES ON FILE FROM  [facility] [phone], SENT REFERRAL TO SCHEDULING

## 2019-08-09 ENCOUNTER — Ambulatory Visit (INDEPENDENT_AMBULATORY_CARE_PROVIDER_SITE_OTHER): Payer: Managed Care, Other (non HMO) | Admitting: Cardiology

## 2019-08-09 ENCOUNTER — Other Ambulatory Visit: Payer: Self-pay

## 2019-08-09 ENCOUNTER — Telehealth: Payer: Self-pay | Admitting: Radiology

## 2019-08-09 ENCOUNTER — Encounter: Payer: Self-pay | Admitting: Cardiology

## 2019-08-09 VITALS — BP 100/60 | HR 66 | Ht 64.0 in | Wt 128.0 lb

## 2019-08-09 DIAGNOSIS — R002 Palpitations: Secondary | ICD-10-CM | POA: Diagnosis not present

## 2019-08-09 DIAGNOSIS — I471 Supraventricular tachycardia: Secondary | ICD-10-CM

## 2019-08-09 LAB — BASIC METABOLIC PANEL
BUN/Creatinine Ratio: 9 (ref 9–23)
BUN: 7 mg/dL (ref 6–24)
CO2: 22 mmol/L (ref 20–29)
Calcium: 9.4 mg/dL (ref 8.7–10.2)
Chloride: 103 mmol/L (ref 96–106)
Creatinine, Ser: 0.77 mg/dL (ref 0.57–1.00)
GFR calc Af Amer: 100 mL/min/{1.73_m2} (ref >59)
GFR calc non Af Amer: 87 mL/min/{1.73_m2} (ref >59)
Glucose: 86 mg/dL (ref 65–99)
Potassium: 5 mmol/L (ref 3.5–5.2)
Sodium: 138 mmol/L (ref 134–144)

## 2019-08-09 LAB — TSH: TSH: 2.22 u[IU]/mL (ref 0.450–4.500)

## 2019-08-09 LAB — MAGNESIUM: Magnesium: 2.1 mg/dL (ref 1.6–2.3)

## 2019-08-09 NOTE — Progress Notes (Signed)
Cardiology Office Note:    Date:  08/09/2019   ID:  Kaitlyn Cole, DOB 1965-11-18, MRN [id number]  PCP:  Patient, No Pcp Per  Cardiologist:  No primary care provider on file.  Electrophysiologist:  None   Referring MD: Aloha Gell, MD   Chief Complaint  Patient presents with  . Palpitations    History of Present Illness:    Kaitlyn Cole is a 54 y.o. female with a hx of SVT s/p ablation 20 years ago who is referred by Dr. Valentino Saxon for evaluation of palpitations.  She reports that since her ablation 20 years ago she has not had any issues until the last 3 to 6 months.  Reports she is having palpitations occurring about once per week.  States that she feels like her heart is racing.  Symptoms are not as severe as prior to her ablation.  Episodes typically last for about 30 minutes.  Feels it may be related to stress.  She denies any lightheadedness, syncope, shortness of breath, chest pain, or lower extremity edema.  States that she walks at least 1 hour/day and denies any exertional symptoms.  She smoked up to 1 pack/day x 15 years, quit at age 21.  She drinks 2 cups of coffee in the morning.  She drinks 1 glass of wine per week.  Has not noticed a relationship between caffeine/alcohol use and her palpitations.  Family history includes mother had both aortic and mitral valve replacements and died of MI at age 58.   No past medical history on file.  Past Surgical History:  Procedure Laterality Date  . ABLATION OF DYSRHYTHMIC FOCUS    . APPENDECTOMY    . BREAST BIOPSY Left 2008  . CESAREAN SECTION     x 2  . ENDOMETRIAL ABLATION      Current Medications: Current Meds  Medication Sig  . estradiol (ESTRACE) 2 MG tablet estradiol  2 mg tabs  . progesterone (PROMETRIUM) 100 MG capsule progesterone  100 mg caps     Allergies:   Patient has no known allergies.   Social History   Socioeconomic History  . Marital status: Married    Spouse name: Forestine Chute  .  Number of children: 2  . Years of education: 51  . Highest education level: Not on file  Occupational History  . Occupation: real Conservation officer, historic buildings  Tobacco Use  . Smoking status: Former Research scientist (life sciences)  . Smokeless tobacco: Never Used  Substance and Sexual Activity  . Alcohol use: Yes    Alcohol/week: 2.0 standard drinks    Types: 2 Standard drinks or equivalent per week  . Drug use: No  . Sexual activity: Yes    Partners: Male  Other Topics Concern  . Not on file  Social History Narrative   Lives with her husband. Her children live independently.   Social Determinants of Health   Financial Resource Strain:   . Difficulty of Paying Living Expenses:   Food Insecurity:   . Worried About Charity fundraiser in the Last Year:   . Arboriculturist in the Last Year:   Transportation Needs:   . Film/video editor (Medical):   Marland Kitchen Lack of Transportation (Non-Medical):   Physical Activity:   . Days of Exercise per Week:   . Minutes of Exercise per Session:   Stress:   . Feeling of Stress :   Social Connections:   . Frequency of Communication with Friends and Family:   .  Frequency of Social Gatherings with Friends and Family:   . Attends Religious Services:   . Active Member of Clubs or Organizations:   . Attends Archivist Meetings:   Marland Kitchen Marital Status:      Family History: The patient's family history includes Diabetes in her mother; Heart disease in her mother.  ROS:   Please see the history of present illness.     All other systems reviewed and are negative.  EKGs/Labs/Other Studies Reviewed:    The following studies were reviewed today:   EKG:  EKG is ordered today.  The ekg ordered today demonstrates normal sinus rhythm, rate 66, no ST/T abnormalities  Recent Labs: No results found for requested labs within last 8760 hours.  Recent Lipid Panel No results found for: CHOL, TRIG, HDL, CHOLHDL, VLDL, LDLCALC, LDLDIRECT  Physical Exam:    VS:  BP 100/60   Pulse  66   Ht 5\' 4"  (1.626 m)   Wt 128 lb (58.1 kg)   SpO2 98%   BMI 21.97 kg/m     Wt Readings from Last 3 Encounters:  08/09/19 128 lb (58.1 kg)  11/01/16 127 lb (57.6 kg)  05/22/14 131 lb (59.4 kg)     GEN: Well nourished, well developed in no acute distress HEENT: Normal NECK: No JVD; No carotid bruits LYMPHATICS: No lymphadenopathy CARDIAC: RRR, no murmurs, rubs, gallops RESPIRATORY:  Clear to auscultation without rales, wheezing or rhonchi  ABDOMEN: Soft, non-tender, non-distended MUSCULOSKELETAL:  No edema; No deformity  SKIN: Warm and dry NEUROLOGIC:  Alert and oriented x 3 PSYCHIATRIC:  Normal affect   ASSESSMENT:    1. Palpitations   2. SVT (supraventricular tachycardia) (HCC)    PLAN:     Palpitations: History of SVT ablation 20 years ago.  Now having palpitations concerning for arrhythmia.  Will evaluate with Zio patch x2 weeks.  Will check BMP, magnesium, TSH  RTC in 3 months   Medication Adjustments/Labs and Tests Ordered: Current medicines are reviewed at length with the patient today.  Concerns regarding medicines are outlined above.  Orders Placed This Encounter  Procedures  . Basic metabolic panel  . Magnesium  . TSH  . LONG TERM MONITOR (3-14 DAYS)  . EKG 12-Lead   No orders of the defined types were placed in this encounter.   Patient Instructions  Medication Instructions:  Your physician recommends that you continue on your current medications as directed. Please refer to the Current Medication list given to you today.  Lab Work: Art gallery manager, Mag, TSH  If you have labs (blood work) drawn today and your tests are completely normal, you will receive your results only by: Marland Kitchen MyChart Message (if you have MyChart) OR . A paper copy in the mail If you have any lab test that is abnormal or we need to change your treatment, we will call you to review the results.   Testing/Procedures:  Bryn Gulling- Long Term Monitor Instructions   Your physician has  requested you wear your ZIO patch monitor 14 days.   This is a single patch monitor.  [employer] supplies one patch monitor per enrollment.  Additional stickers are not available.   Please do not apply patch if you will be having a Nuclear Stress Test, Echocardiogram, Cardiac CT, MRI, or Chest Xray during the time frame you would be wearing the monitor. The patch cannot be worn during these tests.  You cannot remove and re-apply the ZIO XT patch monitor.   Your ZIO patch  monitor will be sent USPS Priority mail from [facility] directly to your home address. The monitor may also be mailed to a PO BOX if home delivery is not available.   It may take 3-5 days to receive your monitor after you have been enrolled.   Once you have received you monitor, please review enclosed instructions.  Your monitor has already been registered assigning a specific monitor serial # to you.   Applying the monitor   Shave hair from upper left chest.   Hold abrader disc by orange tab.  Rub abrader in 40 strokes over left upper chest as indicated in your monitor instructions.   Clean area with 4 enclosed alcohol pads .  Use all pads to assure are is cleaned thoroughly.  Let dry.   Apply patch as indicated in monitor instructions.  Patch will be place under collarbone on left side of chest with arrow pointing upward.   Rub patch adhesive wings for 2 minutes.Remove white label marked "1".  Remove white label marked "2".  Rub patch adhesive wings for 2 additional minutes.   While looking in a mirror, press and release button in center of patch.  A small green light will flash 3-4 times .  This will be your only indicator the monitor has been turned on.     Do not shower for the first 24 hours.  You may shower after the first 24 hours.   Press button if you feel a symptom. You will hear a small click.  Record Date, Time and Symptom in the Patient Log Book.   When you are ready to remove patch, follow  instructions on last 2 pages of Patient Log Book.  Stick patch monitor onto last page of Patient Log Book.   Place Patient Log Book in [place] box.  Use locking tab on box and tape box closed securely.  The Orange and [employer] has [employer] on it.  Please place in mailbox as soon as possible.  Your physician should have your test results approximately 7 days after the monitor has been mailed back to [facility].   Call [place] at [phone] if you have questions regarding your ZIO XT patch monitor.  Call them immediately if you see an orange light blinking on your monitor.   If your monitor falls off in less than 4 days contact our Monitor department at [phone].  If your monitor becomes loose or falls off after 4 days call [employer] at [phone] for suggestions on securing your monitor.   Follow-Up: At [facility], you and your health needs are our priority.  As part of our continuing mission to provide you with exceptional heart care, we have created designated Provider Care Teams.  These Care Teams include your primary Cardiologist (physician) and Advanced Practice Providers (APPs -  Physician Assistants and Nurse Practitioners) who all work together to provide you with the care you need, when you need it.  We recommend signing up for the patient portal called "MyChart".  Sign up information is provided on this After Visit Summary.  MyChart is used to connect with patients for Virtual Visits (Telemedicine).  Patients are able to view lab/test results, encounter notes, upcoming appointments, etc.  Non-urgent messages can be sent to your provider as well.   To learn more about what you can do with MyChart, go to [email or web address].    Your next appointment:   3 month(s)  The format for your next appointment:  In Person  Provider:   Oswaldo Milian, MD       Signed, Donato Heinz, MD  08/09/2019 10:18 AM    [place]

## 2019-08-09 NOTE — Patient Instructions (Signed)
Medication Instructions:  Your physician recommends that you continue on your current medications as directed. Please refer to the Current Medication list given to you today.  Lab Work: Art gallery manager, Mag, TSH  If you have labs (blood work) drawn today and your tests are completely normal, you will receive your results only by: Marland Kitchen MyChart Message (if you have MyChart) OR . A paper copy in the mail If you have any lab test that is abnormal or we need to change your treatment, we will call you to review the results.   Testing/Procedures:  Bryn Gulling- Long Term Monitor Instructions   Your physician has requested you wear your ZIO patch monitor 14 days.   This is a single patch monitor.  [employer] supplies one patch monitor per enrollment.  Additional stickers are not available.   Please do not apply patch if you will be having a Nuclear Stress Test, Echocardiogram, Cardiac CT, MRI, or Chest Xray during the time frame you would be wearing the monitor. The patch cannot be worn during these tests.  You cannot remove and re-apply the ZIO XT patch monitor.   Your ZIO patch monitor will be sent USPS Priority mail from [facility] directly to your home address. The monitor may also be mailed to a PO BOX if home delivery is not available.   It may take 3-5 days to receive your monitor after you have been enrolled.   Once you have received you monitor, please review enclosed instructions.  Your monitor has already been registered assigning a specific monitor serial # to you.   Applying the monitor   Shave hair from upper left chest.   Hold abrader disc by orange tab.  Rub abrader in 40 strokes over left upper chest as indicated in your monitor instructions.   Clean area with 4 enclosed alcohol pads .  Use all pads to assure are is cleaned thoroughly.  Let dry.   Apply patch as indicated in monitor instructions.  Patch will be place under collarbone on left side of chest with arrow pointing upward.    Rub patch adhesive wings for 2 minutes.Remove white label marked "1".  Remove white label marked "2".  Rub patch adhesive wings for 2 additional minutes.   While looking in a mirror, press and release button in center of patch.  A small green light will flash 3-4 times .  This will be your only indicator the monitor has been turned on.     Do not shower for the first 24 hours.  You may shower after the first 24 hours.   Press button if you feel a symptom. You will hear a small click.  Record Date, Time and Symptom in the Patient Log Book.   When you are ready to remove patch, follow instructions on last 2 pages of Patient Log Book.  Stick patch monitor onto last page of Patient Log Book.   Place Patient Log Book in [place] box.  Use locking tab on box and tape box closed securely.  The [employer] has [employer] on it.  Please place in mailbox as soon as possible.  Your physician should have your test results approximately 7 days after the monitor has been mailed back to [facility].   Call [place] at [phone] if you have questions regarding your ZIO XT patch monitor.  Call them immediately if you see an orange light blinking on your monitor.   If your monitor falls off in  less than 4 days contact our Monitor department at [phone].  If your monitor becomes loose or falls off after 4 days call [employer] at [phone] for suggestions on securing your monitor.   Follow-Up: At [facility], you and your health needs are our priority.  As part of our continuing mission to provide you with exceptional heart care, we have created designated Provider Care Teams.  These Care Teams include your primary Cardiologist (physician) and Advanced Practice Providers (APPs -  Physician Assistants and Nurse Practitioners) who all work together to provide you with the care you need, when you need it.  We recommend signing up for the patient portal called "MyChart".   Sign up information is provided on this After Visit Summary.  MyChart is used to connect with patients for Virtual Visits (Telemedicine).  Patients are able to view lab/test results, encounter notes, upcoming appointments, etc.  Non-urgent messages can be sent to your provider as well.   To learn more about what you can do with MyChart, go to [email or web address].    Your next appointment:   3 month(s)  The format for your next appointment:   In Person  Provider:   Oswaldo Milian, MD

## 2019-08-09 NOTE — Telephone Encounter (Signed)
Enrolled patient for a 14 day Zio monitor to be mailed to patients home.  

## 2019-08-13 ENCOUNTER — Encounter: Payer: Self-pay | Admitting: *Deleted

## 2019-08-16 ENCOUNTER — Other Ambulatory Visit (INDEPENDENT_AMBULATORY_CARE_PROVIDER_SITE_OTHER): Payer: Managed Care, Other (non HMO)

## 2019-08-16 DIAGNOSIS — R002 Palpitations: Secondary | ICD-10-CM

## 2019-10-25 IMAGING — MG DIGITAL DIAGNOSTIC BILATERAL MAMMOGRAM WITH TOMO AND CAD
6 of 10 series · 6 of 30 positions shown · non-contrast
Comparison: Previous exam(s).

CLINICAL DATA: Recheck of a palpable mass in the lower inner aspect
of the left breast. This was previously assessed on 06/29/2015.

EXAM:
DIGITAL DIAGNOSTIC BILATERAL MAMMOGRAM WITH CAD AND TOMO

[L CC synth-2D]
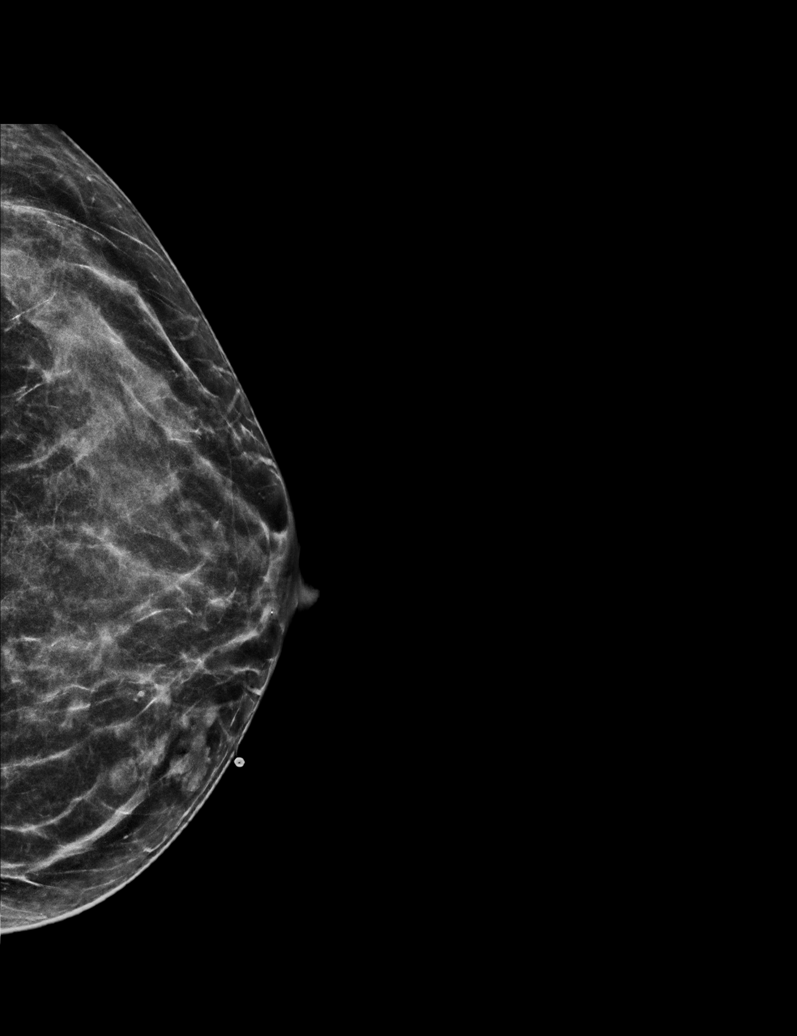

[L TAN synth-2D]
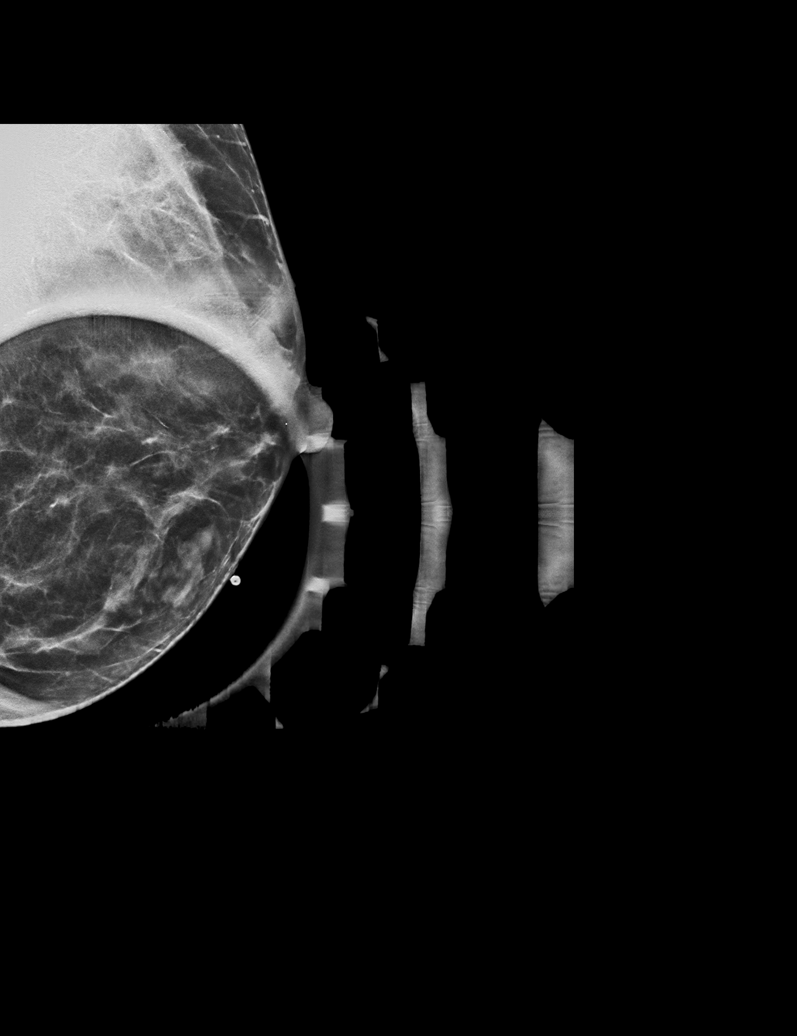

[R CC synth-2D]
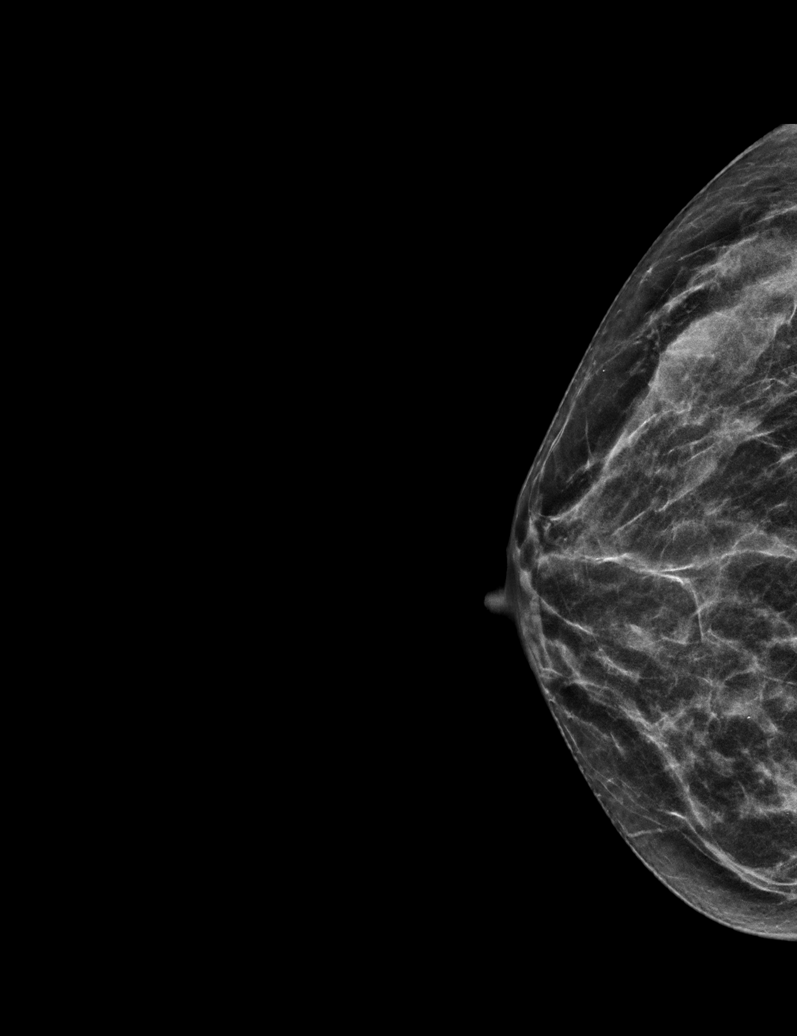

[L MLO synth-2D]
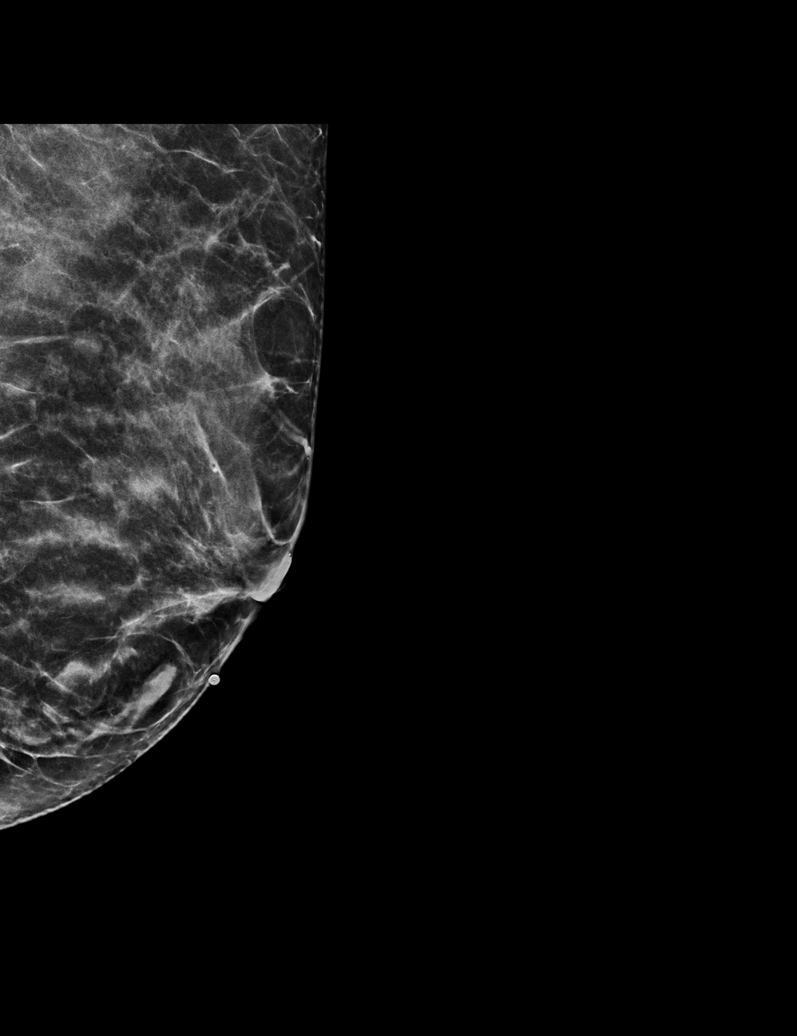

[R MLO synth-2D]
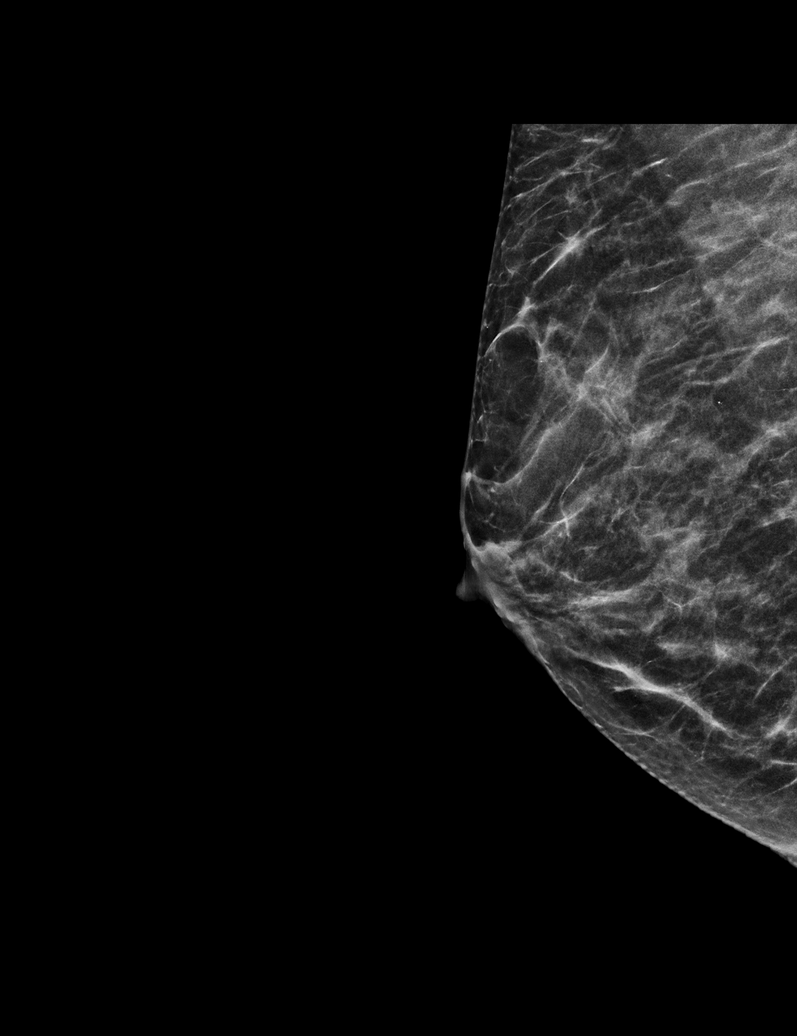

[L MLO tomo · tomo slice 25/48.0]
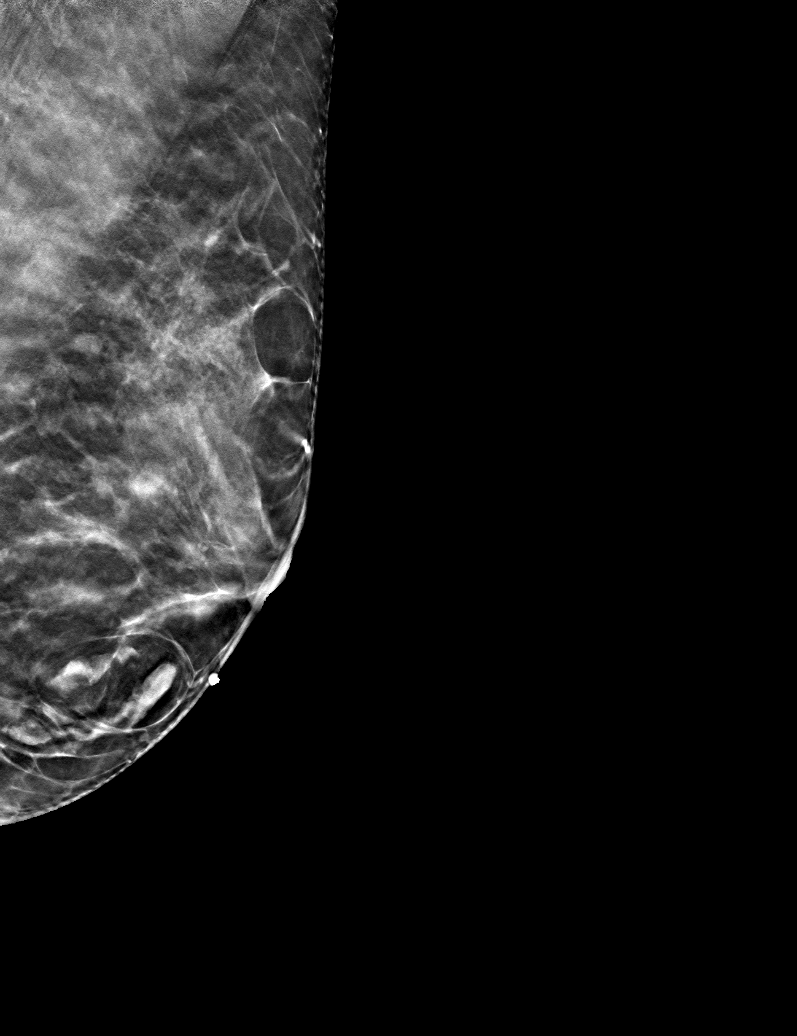

[6 of 30 positions shown; findings below may reference images not displayed]

ACR Breast Density Category c: The breast tissue is heterogeneously
dense, which may obscure small masses.
FINDINGS: The palpable mass corresponds to a stable mass containing fat and
soft tissue elements, characteristic of a hamartoma
(fibroadenomalipoma). There are no new or suspicious masses, no
areas of architectural distortion and no suspicious calcifications.
No mammographic change.

Mammographic images were processed with CAD.
IMPRESSION: 1. No evidence of breast malignancy.
2. Stable benign left breast hamartoma.

RECOMMENDATION:
Screening mammogram in one year.(Code:EX-6-GOG)

I have discussed the findings and recommendations with the patient.
Results were also provided in writing at the conclusion of the
visit. If applicable, a reminder letter will be sent to the patient
regarding the next appointment.

BI-RADS CATEGORY  2: Benign.

## 2019-10-26 NOTE — Progress Notes (Deleted)
Cardiology Office Note:    Date:  10/26/2019   ID:  Kaitlyn Cole, DOB March 31, 1965, MRN [id number]  PCP:  Patient, No Pcp Per  Cardiologist:  No primary care provider on file.  Electrophysiologist:  None   Referring MD: No ref. provider found   No chief complaint on file.   History of Present Illness:    Kaitlyn Cole is a 54 y.o. female with a hx of SVT s/p ablation 20 years ago who presents for follow-up.  She was referred by Dr. Valentino Saxon for evaluation of palpitations, initially seen on 08/09/2019.  She reports that since her ablation 20 years ago she has not had any issues until the prior 3 to 6 months.  Reports she is having palpitations occurring about once per week.  States that she feels like her heart is racing.  Symptoms are not as severe as prior to her ablation.  Episodes typically last for about 30 minutes.  Feels it may be related to stress.  She denies any lightheadedness, syncope, shortness of breath, chest pain, or lower extremity edema.  States that she walks at least 1 hour/day and denies any exertional symptoms.  She smoked up to 1 pack/day x 15 years, quit at age 86.  She drinks 2 cups of coffee in the morning.  She drinks 1 glass of wine per week.  Has not noticed a relationship between caffeine/alcohol use and her palpitations.  Family history includes mother had both aortic and mitral valve replacements and died of MI at age 37.  Labs on 08/09/2019 showed normal electrolytes, TSH.  Zio patch x8 days on 09/17/2019 showed no significant arrhythmias.   Since last clinic visit,  No past medical history on file.  Past Surgical History:  Procedure Laterality Date  . ABLATION OF DYSRHYTHMIC FOCUS    . APPENDECTOMY    . BREAST BIOPSY Left 2008  . CESAREAN SECTION     x 2  . ENDOMETRIAL ABLATION      Current Medications: No outpatient medications have been marked as taking for the 11/01/19 encounter (Appointment) with Donato Heinz, MD.     Allergies:    Patient has no known allergies.   Social History   Socioeconomic History  . Marital status: Married    Spouse name: Forestine Chute  . Number of children: 2  . Years of education: 27  . Highest education level: Not on file  Occupational History  . Occupation: real Conservation officer, historic buildings  Tobacco Use  . Smoking status: Former Research scientist (life sciences)  . Smokeless tobacco: Never Used  Substance and Sexual Activity  . Alcohol use: Yes    Alcohol/week: 2.0 standard drinks    Types: 2 Standard drinks or equivalent per week  . Drug use: No  . Sexual activity: Yes    Partners: Male  Other Topics Concern  . Not on file  Social History Narrative   Lives with her husband. Her children live independently.   Social Determinants of Health   Financial Resource Strain:   . Difficulty of Paying Living Expenses:   Food Insecurity:   . Worried About Charity fundraiser in the Last Year:   . Arboriculturist in the Last Year:   Transportation Needs:   . Film/video editor (Medical):   Marland Kitchen Lack of Transportation (Non-Medical):   Physical Activity:   . Days of Exercise per Week:   . Minutes of Exercise per Session:   Stress:   . Feeling of  Stress :   Social Connections:   . Frequency of Communication with Friends and Family:   . Frequency of Social Gatherings with Friends and Family:   . Attends Religious Services:   . Active Member of Clubs or Organizations:   . Attends Archivist Meetings:   Marland Kitchen Marital Status:      Family History: The patient's family history includes Diabetes in her mother; Heart disease in her mother.  ROS:   Please see the history of present illness.     All other systems reviewed and are negative.  EKGs/Labs/Other Studies Reviewed:    The following studies were reviewed today:   EKG:  EKG is ordered today.  The ekg ordered today demonstrates normal sinus rhythm, rate 66, no ST/T abnormalities  Recent Labs: 08/09/2019: BUN 7; Creatinine, Ser 0.77; Magnesium 2.1;  Potassium 5.0; Sodium 138; TSH 2.220  Recent Lipid Panel No results found for: CHOL, TRIG, HDL, CHOLHDL, VLDL, LDLCALC, LDLDIRECT  Physical Exam:    VS:  There were no vitals taken for this visit.    Wt Readings from Last 3 Encounters:  08/09/19 128 lb (58.1 kg)  11/01/16 127 lb (57.6 kg)  05/22/14 131 lb (59.4 kg)     GEN: Well nourished, well developed in no acute distress HEENT: Normal NECK: No JVD; No carotid bruits LYMPHATICS: No lymphadenopathy CARDIAC: RRR, no murmurs, rubs, gallops RESPIRATORY:  Clear to auscultation without rales, wheezing or rhonchi  ABDOMEN: Soft, non-tender, non-distended MUSCULOSKELETAL:  No edema; No deformity  SKIN: Warm and dry NEUROLOGIC:  Alert and oriented x 3 PSYCHIATRIC:  Normal affect   ASSESSMENT:    No diagnosis found. PLAN:     Palpitations: History of SVT ablation 20 years ago.  Now having palpitations concerning for arrhythmia.  Labs on 08/09/2019 showed normal electrolytes, TSH.  Zio patch x8 days on 09/17/2019 showed no significant arrhythmias.   RTC in ***   Medication Adjustments/Labs and Tests Ordered: Current medicines are reviewed at length with the patient today.  Concerns regarding medicines are outlined above.  No orders of the defined types were placed in this encounter.  No orders of the defined types were placed in this encounter.   There are no Patient Instructions on file for this visit.   Signed, Donato Heinz, MD  10/26/2019 2:31 PM    [place]

## 2019-11-01 ENCOUNTER — Ambulatory Visit: Payer: Managed Care, Other (non HMO) | Admitting: Cardiology

## 2020-02-27 ENCOUNTER — Other Ambulatory Visit: Payer: Self-pay

## 2020-02-27 DIAGNOSIS — Z20822 Contact with and (suspected) exposure to covid-19: Secondary | ICD-10-CM

## 2020-02-29 LAB — SARS-COV-2, NAA 2 DAY TAT

## 2020-02-29 LAB — NOVEL CORONAVIRUS, NAA: SARS-CoV-2, NAA: DETECTED — AB

## 2023-09-19 ENCOUNTER — Ambulatory Visit: Payer: Self-pay

## 2024-01-19 ENCOUNTER — Encounter: Payer: Self-pay | Admitting: Internal Medicine

## 2024-02-05 ENCOUNTER — Telehealth: Payer: Self-pay | Admitting: *Deleted

## 2024-02-05 ENCOUNTER — Ambulatory Visit: Payer: Self-pay | Admitting: *Deleted

## 2024-02-05 VITALS — Ht 64.0 in | Wt 128.0 lb

## 2024-02-05 DIAGNOSIS — Z1211 Encounter for screening for malignant neoplasm of colon: Secondary | ICD-10-CM

## 2024-02-05 MED ORDER — NA SULFATE-K SULFATE-MG SULF 17.5-3.13-1.6 GM/177ML PO SOLN: 1.0000 | Freq: Once | ORAL | 0 refills | Status: AC

## 2024-02-05 NOTE — Telephone Encounter (Signed)
 Attempt to reach pt for pre-visit. LM with call back #.   Will attempt to reach again in 5 min due to no other # listed in profile  Second attempt to reached pt

## 2024-02-05 NOTE — Progress Notes (Signed)
 Pt's name and DOB verified at the beginning of the pre-visit with 2 identifiers    Pt denies any difficulty with ambulating,sitting, laying down or rolling side to side  Pt has no issues moving head neck or swallowing  No egg or soy allergy known to patient   No issues known to pt with past sedation  No FH of Malignant Hyperthermia  Pt is not on home 02   Pt is not on blood thinners   Pt denies issues with constipation   Pt is not on dialysis  Pt hx of SVT had ablation done no issue since Pt denies any upcoming cardiac testing  Patient's chart reviewed by Norleen Schillings CNRA prior to pre-visit and patient appropriate for the [facility].  Pre-visit completed and red dot placed by patient's name on their procedure day (on provider's schedule).    Visit by phone  Pt states weight is 128 lb   Pt given  both [facility] main # and MD on call # prior to instructions.  Informed pt to come in at the time discussed and is shown on PV instructions.  Pt instructed to use Singlecare.com or GoodRx for a price reduction on prep  Instructed pt where to find PV instructions in My Chart. Had abInstructed pt on all aspects of written instructions including med holds clothing to wear and foods to eat and not eat as well as after procedure legal restrictions and to call MD on call if needed.. Pt states understanding. Instructed pt to review instructions again prior to procedure and call main # given if has any questions or any issues. Pt states they will.

## 2024-02-21 ENCOUNTER — Encounter: Payer: Self-pay | Admitting: Internal Medicine

## 2024-02-26 ENCOUNTER — Ambulatory Visit: Admitting: Internal Medicine

## 2024-02-26 ENCOUNTER — Encounter: Payer: Self-pay | Admitting: Internal Medicine

## 2024-02-26 VITALS — BP 94/35 | HR 63 | Temp 97.4°F | Resp 11 | Ht 64.0 in | Wt 128.0 lb

## 2024-02-26 DIAGNOSIS — Z1211 Encounter for screening for malignant neoplasm of colon: Secondary | ICD-10-CM

## 2024-02-26 DIAGNOSIS — D120 Benign neoplasm of cecum: Secondary | ICD-10-CM

## 2024-02-26 MED ORDER — SODIUM CHLORIDE 0.9 % IV SOLN: 500.0000 mL | INTRAVENOUS | Status: DC

## 2024-02-26 NOTE — Progress Notes (Signed)
 Report to PACU, RN, vss, BBS= Clear.

## 2024-02-26 NOTE — Op Note (Signed)
  [facility] Patient Name: Kaitlyn Cole Procedure Date: 02/26/2024 10:52 AM MRN: [id number] Endoscopist: Norleen SAILOR. Abran , MD, [id number] Age: 58 Referring MD:  Date of Birth: 1965-12-07 Gender: Female Account #: [id number] Procedure:                Colonoscopy with cold snare polypectomy x 1 Indications:              Screening for colorectal malignant neoplasm Medicines:                Monitored Anesthesia Care Procedure:                Pre-Anesthesia Assessment:                           - Prior to the procedure, a History and Physical                            was performed, and patient medications and                            allergies were reviewed. The patient's tolerance of                            previous anesthesia was also reviewed. The risks                            and benefits of the procedure and the sedation                            options and risks were discussed with the patient.                            All questions were answered, and informed consent                            was obtained. Prior Anticoagulants: The patient has                            taken no anticoagulant or antiplatelet agents. ASA                            Grade Assessment: II - A patient with mild systemic                            disease. After reviewing the risks and benefits,                            the patient was deemed in satisfactory condition to                            undergo the procedure.                           After obtaining informed consent, the colonoscope  was passed under direct vision. Throughout the                            procedure, the patient's blood pressure, pulse, and                            oxygen saturations were monitored continuously. The                            Olympus Scope SN: [id number] was introduced through                            the anus and advanced to the the cecum, identified                             by appendiceal orifice and ileocecal valve. The                            ileocecal valve, appendiceal orifice, and rectum                            were photographed. The quality of the bowel                            preparation was excellent. The colonoscopy was                            performed without difficulty. The patient tolerated                            the procedure well. The bowel preparation used was                            SUPREP via split dose instruction. Scope In: 11:10:21 AM Scope Out: 11:23:37 AM Scope Withdrawal Time: 0 hours 10 minutes 13 seconds  Total Procedure Duration: 0 hours 13 minutes 16 seconds  Findings:                 A 5 mm polyp was found in the cecum. The polyp was                            removed with a cold snare. Resection and retrieval                            were complete.                           Many diverticula were found in the transverse colon                            and left colon.                           Internal hemorrhoids were found during  retroflexion. The hemorrhoids were small.                           The exam was otherwise without abnormality on                            direct and retroflexion views. Complications:            No immediate complications. Estimated blood loss:                            None. Estimated Blood Loss:     Estimated blood loss: none. Impression:               - One 5 mm polyp in the cecum, removed with a cold                            snare. Resected and retrieved.                           - Diverticulosis in the transverse colon and in the                            left colon.                           - Internal hemorrhoids.                           - The examination was otherwise normal on direct                            and retroflexion views. Recommendation:           - Repeat colonoscopy in 7 years for surveillance.                            - Patient has a contact number available for                            emergencies. The signs and symptoms of potential                            delayed complications were discussed with the                            patient. Return to normal activities tomorrow.                            Written discharge instructions were provided to the                            patient.                           - Resume previous diet.                           -  Continue present medications.                           - Await pathology results. Norleen SAILOR. Abran, MD 02/26/2024 11:28:51 AM This report has been signed electronically.

## 2024-02-26 NOTE — Progress Notes (Signed)
 Pt's states no medical or surgical changes since previsit or office visit.

## 2024-02-26 NOTE — Progress Notes (Signed)
 Called to room to assist during endoscopic procedure.  Patient ID and intended procedure confirmed with present staff. Received instructions for my participation in the procedure from the performing physician.

## 2024-02-26 NOTE — Patient Instructions (Signed)
 Resume previous diet and medications. Awaiting pathology results. Repeat Colonoscopy in 7 years for surveillance. Handout provided on colon polyps and hemorrhoids  YOU HAD AN ENDOSCOPIC PROCEDURE TODAY AT THE [facility]:   Refer to the procedure report that was given to you for any specific questions about what was found during the examination.  If the procedure report does not answer your questions, please call your gastroenterologist to clarify.  If you requested that your care partner not be given the details of your procedure findings, then the procedure report has been included in a sealed envelope for you to review at your convenience later.  YOU SHOULD EXPECT: Some feelings of bloating in the abdomen. Passage of more gas than usual.  Walking can help get rid of the air that was put into your GI tract during the procedure and reduce the bloating. If you had a lower endoscopy (such as a colonoscopy or flexible sigmoidoscopy) you may notice spotting of blood in your stool or on the toilet paper. If you underwent a bowel prep for your procedure, you may not have a normal bowel movement for a few days.  Please Note:  You might notice some irritation and congestion in your nose or some drainage.  This is from the oxygen used during your procedure.  There is no need for concern and it should clear up in a day or so.  SYMPTOMS TO REPORT IMMEDIATELY:  Following lower endoscopy (colonoscopy or flexible sigmoidoscopy):  Excessive amounts of blood in the stool  Significant tenderness or worsening of abdominal pains  Swelling of the abdomen that is new, acute  Fever of 100F or higher  For urgent or emergent issues, a gastroenterologist can be reached at any hour by calling [phone]. Do not use MyChart messaging for urgent concerns.    DIET:  We do recommend a small meal at first, but then you may proceed to your regular diet.  Drink plenty of fluids but you should avoid alcoholic  beverages for 24 hours.  ACTIVITY:  You should plan to take it easy for the rest of today and you should NOT DRIVE or use heavy machinery until tomorrow (because of the sedation medicines used during the test).    FOLLOW UP: Our staff will call the number listed on your records the next business day following your procedure.  We will call around 7:15- 8:00 am to check on you and address any questions or concerns that you may have regarding the information given to you following your procedure. If we do not reach you, we will leave a message.     If any biopsies were taken you will be contacted by phone or by letter within the next 1-3 weeks.  Please call us  at [phone] if you have not heard about the biopsies in 3 weeks.    SIGNATURES/CONFIDENTIALITY: You and/or your care partner have signed paperwork which will be entered into your electronic medical record.  These signatures attest to the fact that that the information above on your After Visit Summary has been reviewed and is understood.  Full responsibility of the confidentiality of this discharge information lies with you and/or your care-partner.

## 2024-02-26 NOTE — Progress Notes (Signed)
 HISTORY OF PRESENT ILLNESS:  Kaitlyn Cole is a 58 y.o. female presents directly for screening colonoscopy.  No complaints  REVIEW OF SYSTEMS:  All non-GI ROS negative except for  Past Medical History:  Diagnosis Date   Rosacea    SVT (supraventricular tachycardia)     Past Surgical History:  Procedure Laterality Date   ABLATION OF DYSRHYTHMIC FOCUS     APPENDECTOMY     BREAST BIOPSY Left 2008   CESAREAN SECTION     x 2   ENDOMETRIAL ABLATION      Social History Kaitlyn Cole  reports that she has quit smoking. She has never used smokeless tobacco. She reports current alcohol use of about 2.0 standard drinks of alcohol per week. She reports that she does not use drugs.  family history includes Diabetes in her mother; Heart disease in her mother.  Allergies[1]     PHYSICAL EXAMINATION: Vital signs: BP (!) 111/56   Pulse 68   Temp (!) 97.4 F (36.3 C) (Temporal)   Resp 14   Ht 5' 4 (1.626 m)   Wt 128 lb (58.1 kg)   SpO2 100%   BMI 21.97 kg/m  General: Well-developed, well-nourished, no acute distress HEENT: Sclerae are anicteric, conjunctiva pink. Oral mucosa intact Lungs: Clear Heart: Regular Abdomen: soft, nontender, nondistended, no obvious ascites, no peritoneal signs, normal bowel sounds. No organomegaly. Extremities: No edema Psychiatric: alert and oriented x3. Cooperative     ASSESSMENT:  Colon cancer screening   PLAN:  Screening colonoscopy         [1] No Known Allergies

## 2024-02-27 ENCOUNTER — Telehealth: Payer: Self-pay | Admitting: *Deleted

## 2024-02-27 NOTE — Telephone Encounter (Signed)
 No answer for follow up call. Left a message.

## 2024-02-29 LAB — SURGICAL PATHOLOGY

## 2024-03-06 ENCOUNTER — Ambulatory Visit: Payer: Self-pay | Admitting: Internal Medicine
